# Patient Record
Sex: Male | Born: 1975 | Race: Black or African American | Hispanic: No | Smoking: Never smoker
Health system: Southern US, Community
[De-identification: ages and names within clinical notes are randomized; demographics above are authoritative.]

## PROBLEM LIST (undated history)

## (undated) DIAGNOSIS — I119 Hypertensive heart disease without heart failure: Secondary | ICD-10-CM

## (undated) DIAGNOSIS — G4730 Sleep apnea, unspecified: Secondary | ICD-10-CM

## (undated) DIAGNOSIS — I10 Essential (primary) hypertension: Secondary | ICD-10-CM

## (undated) DIAGNOSIS — J189 Pneumonia, unspecified organism: Secondary | ICD-10-CM

## (undated) DIAGNOSIS — I214 Non-ST elevation (NSTEMI) myocardial infarction: Secondary | ICD-10-CM

## (undated) HISTORY — DX: Non-ST elevation (NSTEMI) myocardial infarction: I21.4

## (undated) HISTORY — DX: Sleep apnea, unspecified: G47.30

## (undated) HISTORY — DX: Essential (primary) hypertension: I10

## (undated) HISTORY — DX: Hypertensive heart disease without heart failure: I11.9

## (undated) HISTORY — DX: Pneumonia, unspecified organism: J18.9

---

## 1998-08-30 ENCOUNTER — Emergency Department (HOSPITAL_COMMUNITY): Admission: EM | Admit: 1998-08-30 | Discharge: 1998-08-30 | Payer: Self-pay | Admitting: Emergency Medicine

## 1999-07-28 ENCOUNTER — Emergency Department (HOSPITAL_COMMUNITY): Admission: EM | Admit: 1999-07-28 | Discharge: 1999-07-28 | Payer: Self-pay | Admitting: Emergency Medicine

## 1999-07-28 ENCOUNTER — Encounter: Payer: Self-pay | Admitting: Emergency Medicine

## 2003-12-06 ENCOUNTER — Emergency Department (HOSPITAL_COMMUNITY): Admission: EM | Admit: 2003-12-06 | Discharge: 2003-12-06 | Payer: Self-pay | Admitting: Emergency Medicine

## 2009-01-02 ENCOUNTER — Ambulatory Visit: Payer: Self-pay | Admitting: Internal Medicine

## 2009-01-02 ENCOUNTER — Inpatient Hospital Stay (HOSPITAL_COMMUNITY): Admission: EM | Admit: 2009-01-02 | Discharge: 2009-01-10 | Payer: Self-pay | Admitting: Emergency Medicine

## 2009-01-10 ENCOUNTER — Encounter (INDEPENDENT_AMBULATORY_CARE_PROVIDER_SITE_OTHER): Payer: Self-pay | Admitting: Internal Medicine

## 2009-01-10 ENCOUNTER — Encounter: Payer: Self-pay | Admitting: Internal Medicine

## 2009-01-11 ENCOUNTER — Ambulatory Visit: Payer: Self-pay | Admitting: Internal Medicine

## 2009-01-11 ENCOUNTER — Telehealth: Payer: Self-pay | Admitting: *Deleted

## 2009-01-11 ENCOUNTER — Encounter: Payer: Self-pay | Admitting: Internal Medicine

## 2009-01-11 DIAGNOSIS — N189 Chronic kidney disease, unspecified: Secondary | ICD-10-CM | POA: Insufficient documentation

## 2009-01-11 DIAGNOSIS — I10 Essential (primary) hypertension: Secondary | ICD-10-CM

## 2009-01-13 ENCOUNTER — Encounter: Payer: Self-pay | Admitting: Internal Medicine

## 2009-01-13 ENCOUNTER — Ambulatory Visit: Payer: Self-pay | Admitting: *Deleted

## 2009-01-13 ENCOUNTER — Telehealth: Payer: Self-pay | Admitting: Internal Medicine

## 2009-01-13 LAB — CONVERTED CEMR LAB
CO2: 25 meq/L (ref 19–32)
GFR calc Af Amer: >60 mL/min (ref >60)
Glucose, Bld: 89 mg/dL (ref 70–99)
Potassium: 4.3 meq/L (ref 3.5–5.3)
Sodium: 138 meq/L (ref 135–145)

## 2009-01-18 ENCOUNTER — Ambulatory Visit: Payer: Self-pay | Admitting: Internal Medicine

## 2009-01-27 ENCOUNTER — Telehealth: Payer: Self-pay | Admitting: *Deleted

## 2009-02-01 ENCOUNTER — Ambulatory Visit: Payer: Self-pay | Admitting: Internal Medicine

## 2009-02-01 ENCOUNTER — Encounter: Payer: Self-pay | Admitting: Internal Medicine

## 2009-02-01 DIAGNOSIS — R5381 Other malaise: Secondary | ICD-10-CM

## 2009-02-01 DIAGNOSIS — R5383 Other fatigue: Secondary | ICD-10-CM

## 2009-02-02 LAB — CONVERTED CEMR LAB
BUN: 14 mg/dL (ref 6–23)
Chloride: 108 meq/L (ref 96–112)
GFR calc non Af Amer: 59 mL/min — ABNORMAL LOW (ref >60)
Glucose, Bld: 108 mg/dL — ABNORMAL HIGH (ref 70–99)
Potassium: 3.8 meq/L (ref 3.5–5.3)
Sodium: 142 meq/L (ref 135–145)

## 2009-05-09 ENCOUNTER — Telehealth: Payer: Self-pay | Admitting: Internal Medicine

## 2009-05-16 ENCOUNTER — Ambulatory Visit: Payer: Self-pay | Admitting: Internal Medicine

## 2009-08-09 ENCOUNTER — Telehealth: Payer: Self-pay | Admitting: Internal Medicine

## 2010-01-05 IMAGING — CR DG CHEST 2V
2 series · 2 of 2 positions shown · non-contrast
Comparison: None available.

CLINICAL DATA: Cough and fever.

CHEST - 2 VIEW

[w chest pa]
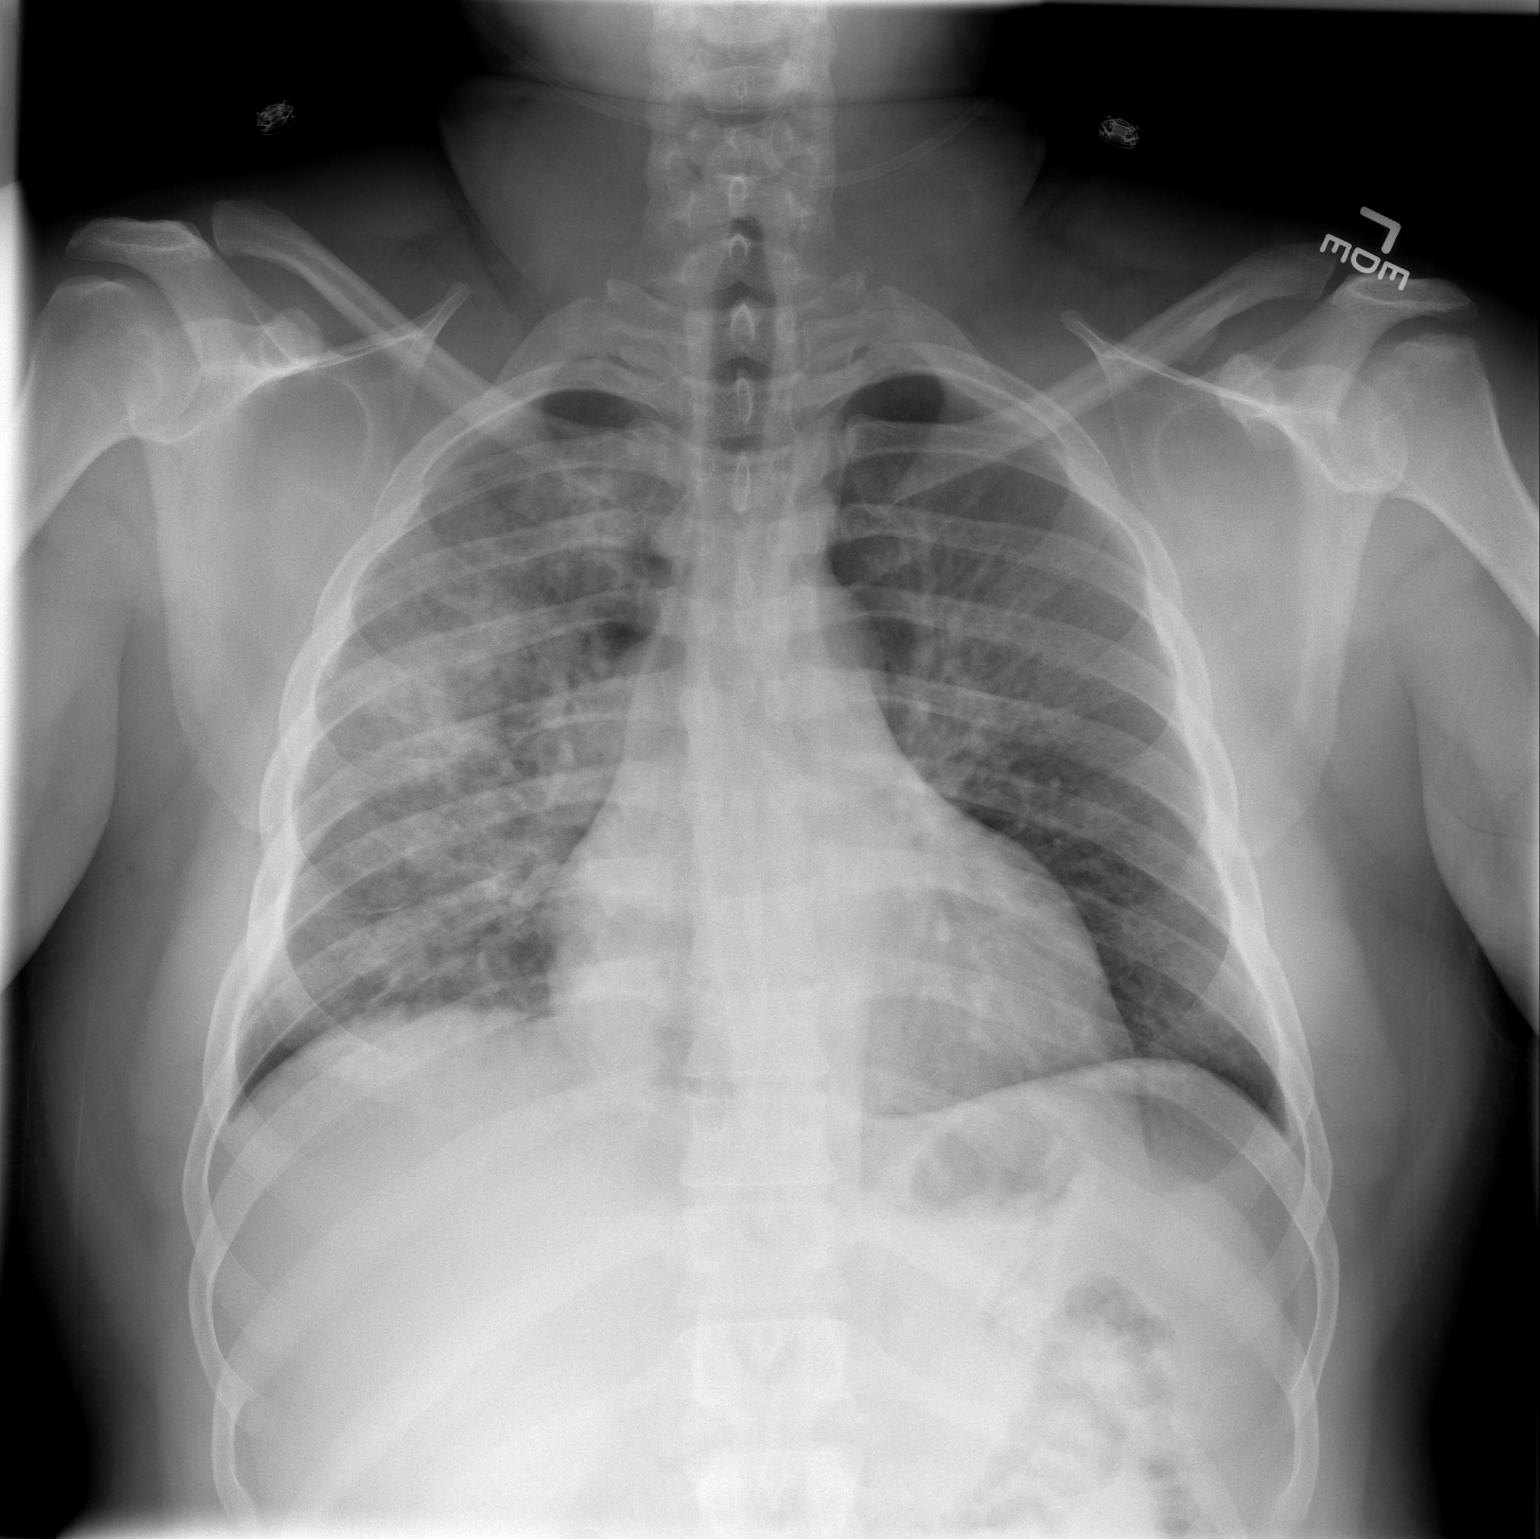

[w chest lat]
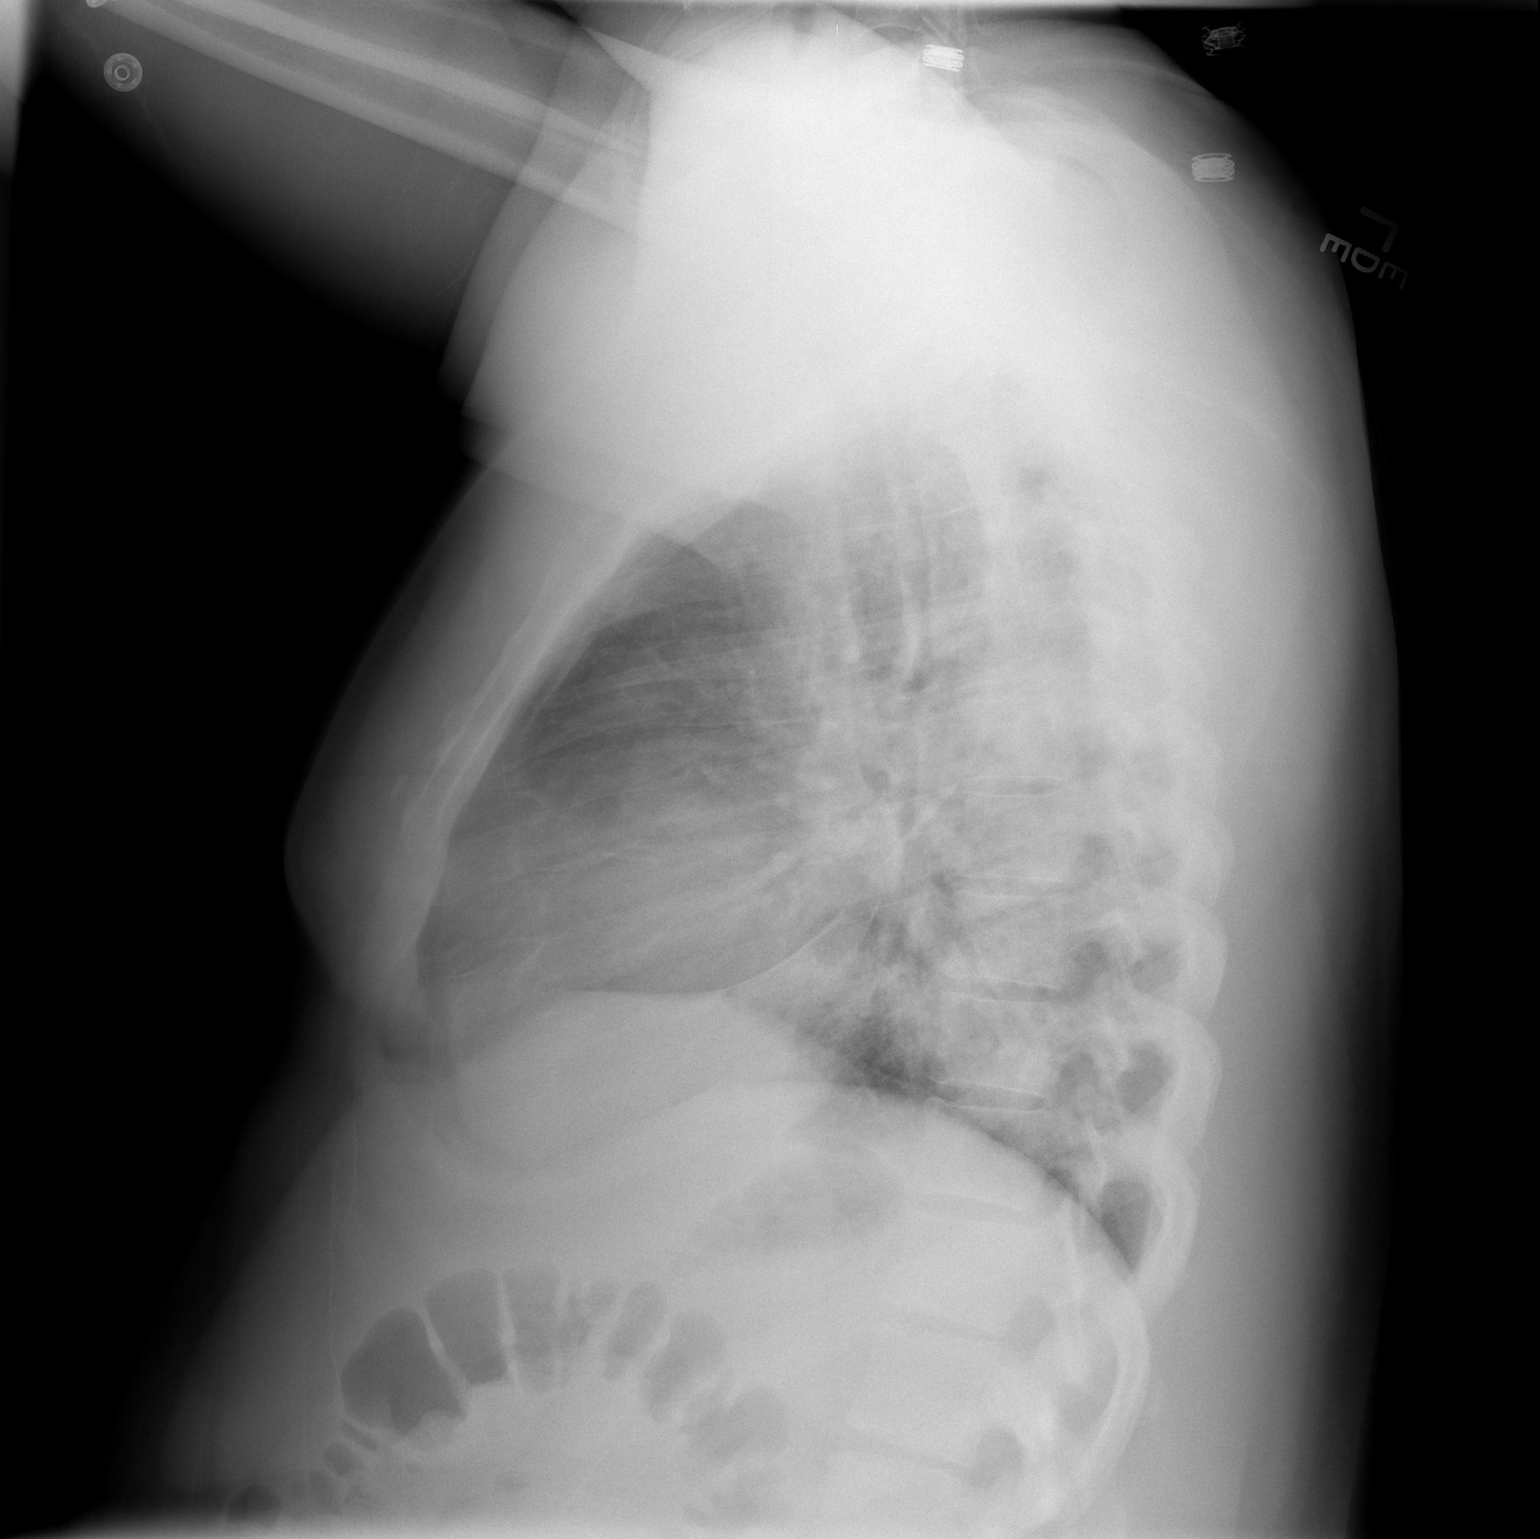

[2 of 2 positions shown; findings below may reference images not displayed]

FINDINGS: The patient has right much worse than left airspace
disease most confluent in the posterior right upper lobe and
superior segment of the right lower lobe compatible with pneumonia.
Heart size is normal.  Tiny right pleural effusion noted.
IMPRESSION: Right worse than left airspace disease is most compatible with
pneumonia.

## 2010-01-06 IMAGING — CR DG CHEST 1V PORT
1 series · 1 of 1 positions shown · non-contrast
Comparison: 01/02/2009.

CLINICAL DATA: Shortness of breath.  Respiratory distress.

PORTABLE CHEST - 1 VIEW

[view not recorded]
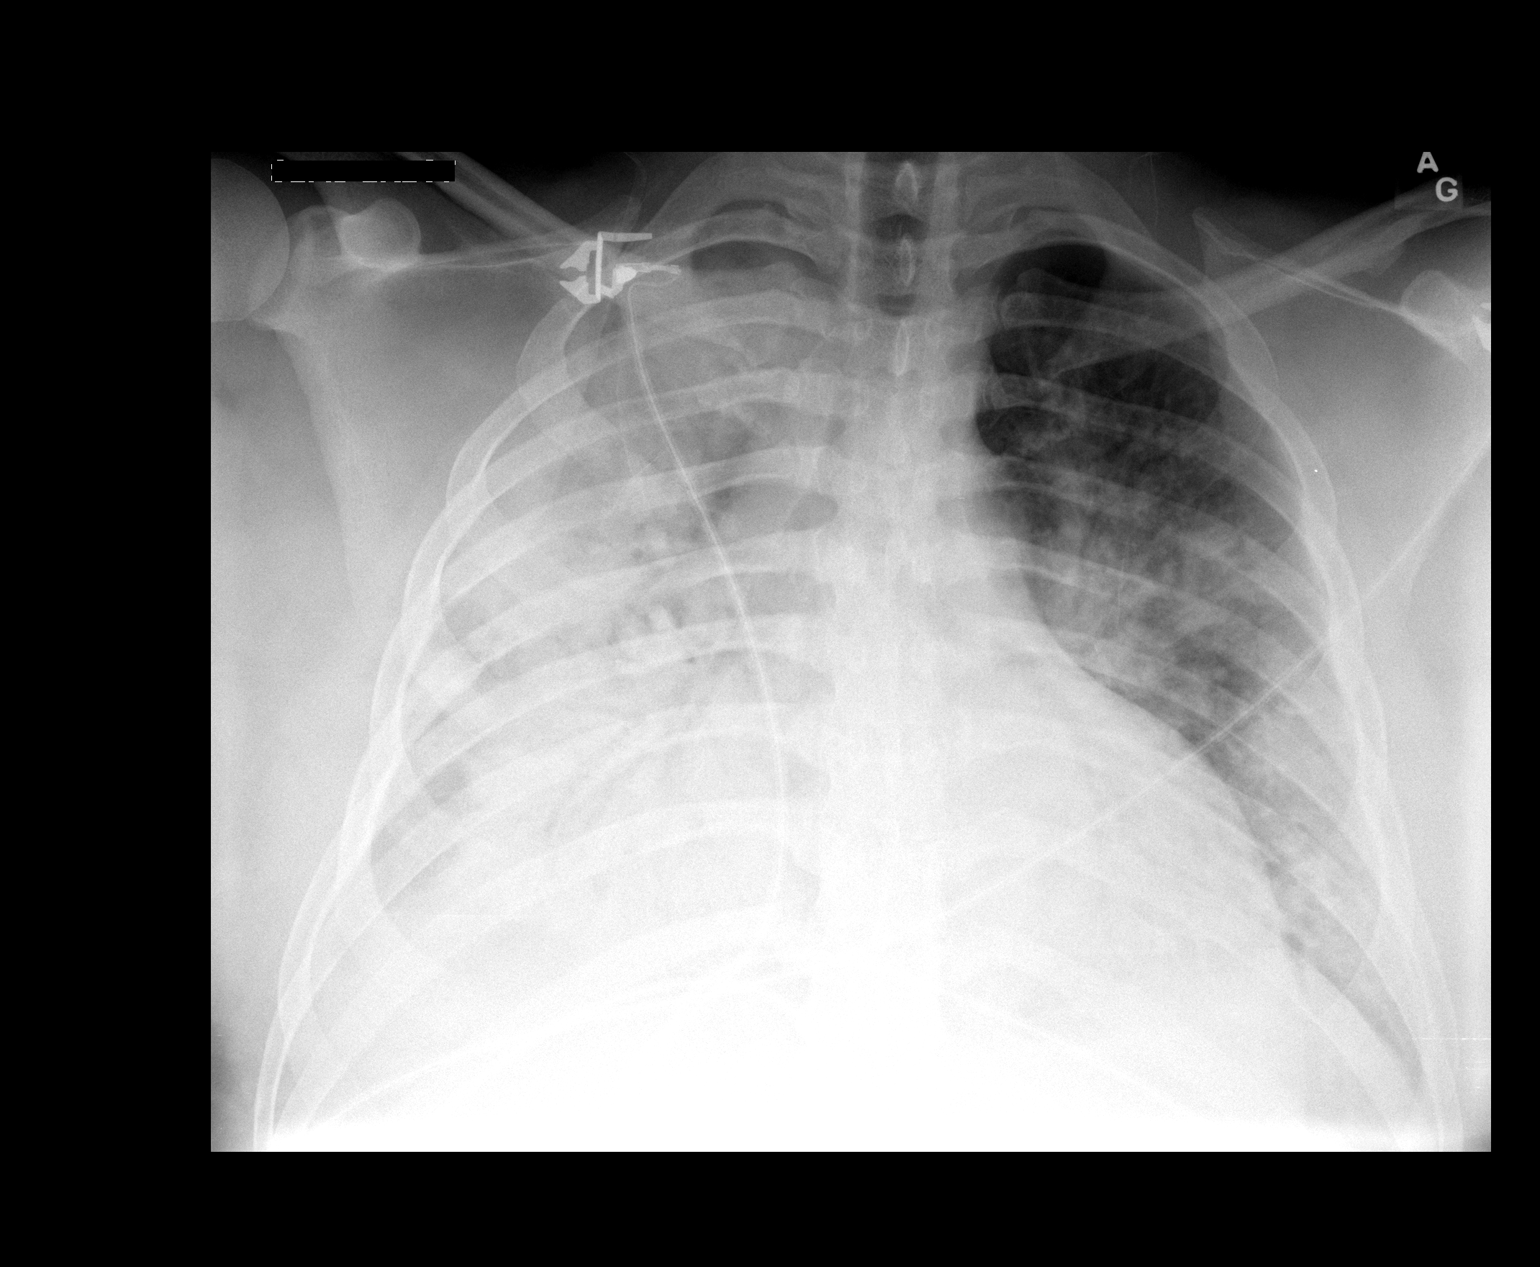

[1 of 1 positions shown; findings below may reference images not displayed]

FINDINGS: Interval significant increase in bilateral airspace
opacity, especially on the right.  There is obscuration of the
right heart border with no gross change in the size of the heart.
A probable small right pleural effusion is noted.  Unremarkable
bones.
IMPRESSION: 1.  Significant progression of bilateral pneumonia, especially on
the right.
2.  Probable small right pleural effusion.

## 2010-01-10 IMAGING — CR DG CHEST 1V PORT
1 series · 1 of 1 positions shown · non-contrast
Comparison: 01/06/2009

CLINICAL DATA: Pneumonia, extubation

PORTABLE CHEST - 1 VIEW

[AP]
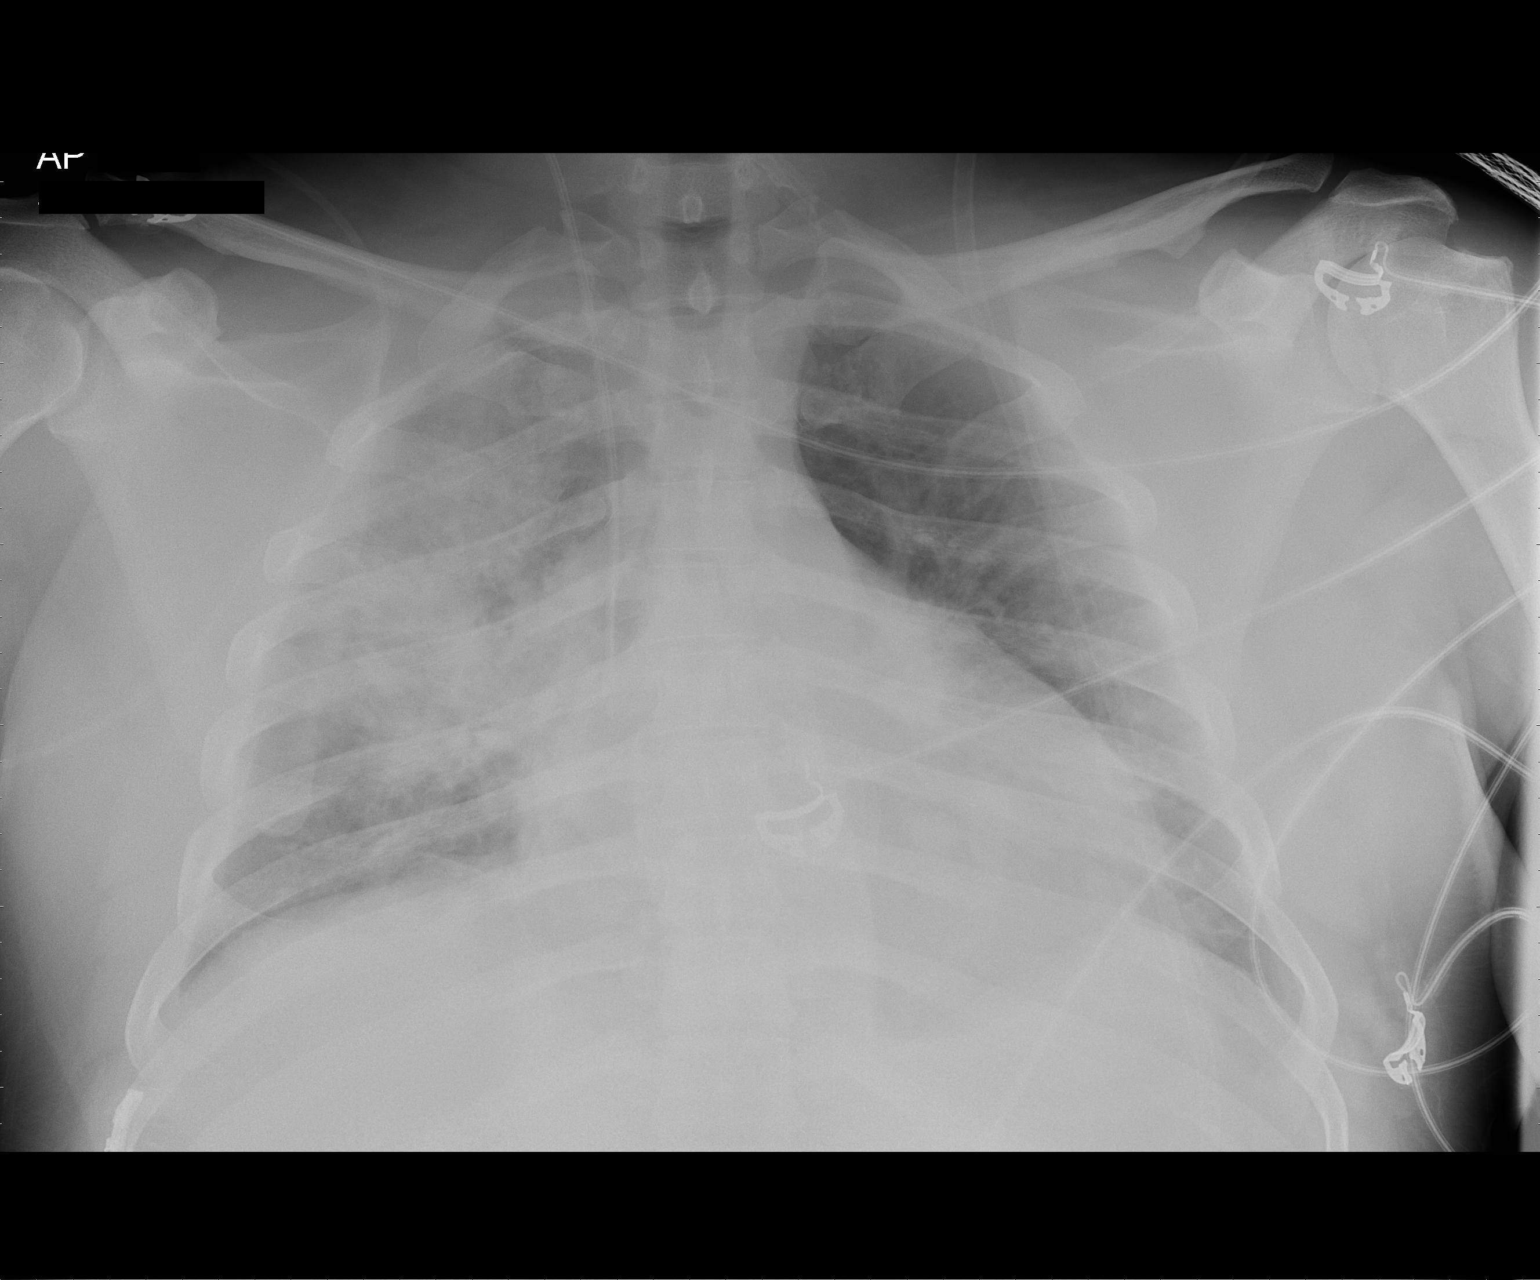

[1 of 1 positions shown; findings below may reference images not displayed]

FINDINGS: Interval removal of the endotracheal tube and NG tube.
Right IJ central line remains in the SVC.  Consolidative pneumonia
persist in the right lung similar to yesterday's exam.  Cardiac
silhouette appears enlarged.  Left lung aeration is stable.  No
large effusion or pneumothorax.  Midline trachea.
IMPRESSION: Persistent right lung consolidative pneumonia

## 2010-07-20 ENCOUNTER — Inpatient Hospital Stay (HOSPITAL_COMMUNITY)
Admission: EM | Admit: 2010-07-20 | Discharge: 2010-07-23 | Payer: Self-pay | Source: Home / Self Care | Admitting: Emergency Medicine

## 2010-07-20 ENCOUNTER — Ambulatory Visit: Payer: Self-pay | Admitting: Cardiology

## 2010-07-21 ENCOUNTER — Encounter: Payer: Self-pay | Admitting: Cardiology

## 2010-07-24 ENCOUNTER — Encounter: Payer: Self-pay | Admitting: Cardiology

## 2010-07-24 ENCOUNTER — Ambulatory Visit (HOSPITAL_COMMUNITY)
Admission: RE | Admit: 2010-07-24 | Discharge: 2010-07-24 | Payer: Self-pay | Source: Home / Self Care | Admitting: Cardiology

## 2010-07-24 ENCOUNTER — Ambulatory Visit: Payer: Self-pay

## 2010-07-25 ENCOUNTER — Telehealth (INDEPENDENT_AMBULATORY_CARE_PROVIDER_SITE_OTHER): Payer: Self-pay

## 2010-07-26 ENCOUNTER — Ambulatory Visit: Payer: Self-pay

## 2010-07-26 ENCOUNTER — Encounter: Payer: Self-pay | Admitting: Internal Medicine

## 2010-07-26 ENCOUNTER — Encounter: Payer: Self-pay | Admitting: Physician Assistant

## 2010-07-26 ENCOUNTER — Encounter (HOSPITAL_COMMUNITY)
Admission: RE | Admit: 2010-07-26 | Discharge: 2010-09-18 | Payer: Self-pay | Source: Home / Self Care | Attending: Cardiology | Admitting: Cardiology

## 2010-07-26 ENCOUNTER — Ambulatory Visit: Payer: Self-pay | Admitting: Internal Medicine

## 2010-07-26 DIAGNOSIS — R9430 Abnormal result of cardiovascular function study, unspecified: Secondary | ICD-10-CM | POA: Insufficient documentation

## 2010-07-26 DIAGNOSIS — I5032 Chronic diastolic (congestive) heart failure: Secondary | ICD-10-CM

## 2010-07-26 DIAGNOSIS — I214 Non-ST elevation (NSTEMI) myocardial infarction: Secondary | ICD-10-CM | POA: Insufficient documentation

## 2010-07-26 LAB — CONVERTED CEMR LAB
Basophils Relative: 0.8 % (ref 0.0–3.0)
CO2: 30 meq/L (ref 19–32)
Chloride: 103 meq/L (ref 96–112)
Creatinine, Ser: 1.3 mg/dL (ref 0.4–1.5)
Eosinophils Absolute: 0.1 10*3/uL (ref 0.0–0.7)
Eosinophils Relative: 1.6 % (ref 0.0–5.0)
HCT: 38.7 % — ABNORMAL LOW (ref 39.0–52.0)
Lymphs Abs: 2.3 10*3/uL (ref 0.7–4.0)
MCHC: 31.6 g/dL (ref 30.0–36.0)
MCV: 76 fL — ABNORMAL LOW (ref 78.0–100.0)
Monocytes Absolute: 0.8 10*3/uL (ref 0.1–1.0)
Neutro Abs: 4.5 10*3/uL (ref 1.4–7.7)
Neutrophils Relative %: 57.4 % (ref 43.0–77.0)
Potassium: 3.9 meq/L (ref 3.5–5.1)
RBC: 5.09 M/uL (ref 4.22–5.81)
WBC: 7.8 10*3/uL (ref 4.5–10.5)

## 2010-07-30 ENCOUNTER — Ambulatory Visit
Admission: RE | Admit: 2010-07-30 | Discharge: 2010-07-30 | Payer: Self-pay | Source: Home / Self Care | Attending: Cardiology | Admitting: Cardiology

## 2010-08-01 ENCOUNTER — Telehealth: Payer: Self-pay | Admitting: Cardiology

## 2010-08-02 ENCOUNTER — Telehealth (INDEPENDENT_AMBULATORY_CARE_PROVIDER_SITE_OTHER): Payer: Self-pay | Admitting: *Deleted

## 2010-08-03 ENCOUNTER — Ambulatory Visit: Payer: Self-pay | Admitting: Pulmonary Disease

## 2010-08-07 ENCOUNTER — Encounter: Payer: Self-pay | Admitting: Cardiology

## 2010-08-07 ENCOUNTER — Telehealth: Payer: Self-pay | Admitting: Cardiology

## 2010-08-29 ENCOUNTER — Encounter: Payer: Self-pay | Admitting: Cardiology

## 2010-08-30 ENCOUNTER — Ambulatory Visit: Admit: 2010-08-30 | Payer: Self-pay | Admitting: Cardiology

## 2010-09-06 ENCOUNTER — Telehealth: Payer: Self-pay | Admitting: Pulmonary Disease

## 2010-09-16 LAB — CONVERTED CEMR LAB
CO2: 27 meq/L (ref 19–32)
Chloride: 100 meq/L (ref 96–112)
GFR calc non Af Amer: 51 mL/min — ABNORMAL LOW (ref >60)
Sodium: 135 meq/L (ref 135–145)

## 2010-09-18 NOTE — Assessment & Plan Note (Signed)
Summary: Cardiology Nuclear Testing  Nuclear Med Background Indications for Stress Test: Evaluation for Ischemia, Post Hospital  Indications Comments: 07/23/10 Acute on chronic CHF,HTN urgency with increase troponin, presumed NSTEMI.  History: Abnormal EKG, Asthma, Myocardial Infarction  History Comments: 07/20/10 NSTEMI. 07/24/10 Echo: EF=60-65%, severe LVH. Hx. CHF.  Symptoms: DOE, Fatigue, Palpitations, SOB    Nuclear Pre-Procedure Cardiac Risk Factors: Family History - CAD, Hypertension Caffeine/Decaff Intake: None NPO After: 11:30 PM Lungs: clear IV 0.9% NS with Angio Cath: 22g     IV Site: L Antecubital IV Started by: Irean Hong, RN Chest Size (in) 46     Height (in): 67 Weight (lb): 228 BMI: 35.84 Tech Comments: Held bystolic today. This patient's EKG is very abnormal and now has inverted T's in all inferior leads. Having the pictures checked.  Nuclear Med Study 1 or 2 day study:  1 day     Stress Test Type:  Eugenie Birks Reading MD:  Arvilla Meres, MD     Referring MD:  Shela Commons.Hochrein Resting Radionuclide:  Technetium 38m Tetrofosmin     Resting Radionuclide Dose:  11 mCi  Stress Radionuclide:  Technetium 64m Tetrofosmin     Stress Radionuclide Dose:  33 mCi   Stress Protocol  Max Systolic BP: 145 mm Hg Lexiscan: 0.4 mg   Stress Test Technologist:  Milana Na, EMT-P     Nuclear Technologist:  Doyne Keel, CNMT  Rest Procedure  Myocardial perfusion imaging was performed at rest 45 minutes following the intravenous administration of Technetium 59m Tetrofosmin.  Stress Procedure  The patient received IV Lexiscan 0.4 mg over 15-seconds.  Technetium 8m Tetrofosmin injected at 30-seconds.  There were no significant changes with infusion.  Quantitative spect images were obtained after a 45 minute delay.  QPS Raw Data Images:  Normal; no motion artifact; LV is dilated Stress Images:  Decresed uptake in the anterior, lateral and base of the inferior wall.  Rest  Images:  Mild global decrease in uptake  Subtraction (SDS):  Reversible defects in the anterior, lateral and inferior walls.  Transient Ischemic Dilatation:  1.17  (Normal <1.22)  Lung/Heart Ratio:  .21  (Normal <0.45)  Quantitative Gated Spect Images QGS EDV:  186 ml QGS ESV:  109 ml QGS EF:  41 % QGS cine images:  Global hypokinesis  Findings Abormal nuclear study      Overall Impression  Exercise Capacity: Lexiscan with no exercise. ECG Impression: No significant ST segment change suggestive of ischemia. Overall Impression: Abnormal stress nuclear study. Overall Impression Comments: The LV is dilated with mild to moderate LVglobal hypokineis. Perfusion is globally dimished so interpretation is difficult. There appears to be reversible defects in the anterior, lateral and inferior walls with borderline transient ischemic dilation with stress suggestive of possible multi-vessel CAD.   Appended Document: Cardiology Nuclear Testing Patient is to be cathed on Monday.

## 2010-09-18 NOTE — Letter (Signed)
Summary: Cardiac Catheterization Instructions- [facility]  [facility], Main Office  [address]   [place], [place] [address]   Phone: [phone]  Fax: [phone]     07/26/2010 MRN: [id number]  [facility] [address] [place], [place]  [address]  Dear Mr. Eyster,   You are scheduled for a Cardiac Catheterization on 07/30/10 with Dr.Hochrein.  Please arrive to the 1st floor of the Heart and Vascular Center at [facility] at 12:30 PM on the day of your procedure. Please do not arrive before 6:30 a.m. Call the Heart and Vascular Center at [phone] if you are unable to make your appointmnet. The Code to get into the parking garage under the building is 0003. Take the elevators to the 1st floor. You must have someone to drive you home. Someone must be with you for the first 24 hours after you arrive home. Please wear clothes that are easy to get on and off and wear slip-on shoes. Do not eat or drink after midnight except water with your medications that morning. Bring all your medications and current insurance cards with you.  ___ DO NOT take these medications before your procedure: ________________________________________________________________  _X__ Make sure you take your aspirin.  __X_ You may take ALL of your medications with water that morning. ________________________________________________________________________________________________________________________________  ___ DO NOT take ANY medications before your procedure.  ___ Pre-med instructions:  ________________________________________________________________________________________________________________________________  The usual length of stay after your procedure is 2 to 3 hours. This can vary.  If you have any questions, please call the office at the number listed above.   Ollen Gross, RN, BSN

## 2010-09-18 NOTE — Progress Notes (Signed)
Summary: Nuc. Pre-Procedure  Phone Note Outgoing Call Call back at [facility] Phone [phone]   Call placed by: Irean Hong, RN,  July 25, 2010 2:22 PM Summary of Call: Left message with information on Myoview Information Sheet (see scanned document for details).      Nuclear Med Background Indications for Stress Test: Evaluation for Ischemia, Post Hospital  Indications Comments: 07/23/10 Acute on chronic CHF,HTN urgency with increase troponin, presumed NSTEMI.  History: Abnormal EKG, Asthma, Echo, Myocardial Infarction  History Comments: 07/20/10 NSTEMI. 07/24/10 Echo: EF=60-65%, severe LVH. Hx. CHF.  Symptoms: DOE, Fatigue, SOB    Nuclear Pre-Procedure Cardiac Risk Factors: Family History - CAD, Hypertension Height (in): 67

## 2010-09-18 NOTE — Assessment & Plan Note (Signed)
Summary: abnormal nuclear stress test/nm   Visit Type:  Initial Consult Primary Provider:  Clerance Lav MD  CC:  Add on Abnormal nuclear stress test.  History of Present Illness: Primary Cardiologist:  Dr. Rollene Rotunda  Tyler Collier is a 35 yo male with HTN who presented to [facility] on 12/2 with abrupt onset of dsypnea.  He had a very high BP and an elevated Troponin.  It was felt that he developed a/c diastolic CHF in the setting of HTN urgency resulting in a Type 2 NSTEMI.  His meds were adjusted for appropriate BP control.  He was discharged and had an echo on 12/6 that demonstrated severe LVH, EF 60-65%; Grade 2 diastolic dysfunction and mild LAE.  He underwent outpatient Lexiscan myoview today that is markedly abnormal.  The official reading is pending but he appears to have ischemia in the inferior, anterior, lateral and apical walls.  His calculated EF is 41% and his T.I.D. ratio is 1.17.  He is added on to my schedule for further evaluation.  He denies any chest pain.  He has occ. dyspnea.  He denies any change in his symptoms.  He notes NYHA class 2 symptoms.  He denies orthopnea, PND or edema.  No syncope.  He is a somewhat anxious about his condition.    Current Medications (verified): 1)  Ventolin Hfa 108 (90 Base) Mcg/act Aers (Albuterol Sulfate) .... Take Two Puffs Every Four Hours As Needed For Shortness of Breath 2)  Norvasc 10 Mg Tabs (Amlodipine Besylate) .... Take 1 Tablet By Mouth Once A Day 3)  Bystolic 20 Mg Tabs (Nebivolol Hcl) .... Take 1 Tablet By Mouth Once A Day 4)  Hydrochlorothiazide 25 Mg Tabs (Hydrochlorothiazide) .... Take One Tablet By Mouth Daily. 5)  Potassium Chloride Cr 10 Meq Cr-Tabs (Potassium Chloride) .... Take 1 Tablet By Mouth Once A Day  Allergies: 1)  ! Lisinopril  Past History:  Past Medical History: Pneumonia- multilobar that required intubation s/p Type II NSTEMI 07/2010 in setting of a/c diastolic CHF and HTN  urgency Hypertension Chronic Diastolic CHF Echo 07/2010:  severe LVH; EF 60-65%; Gr. 2 Diast. Dysfxn; mild LAE Probable sleep apnea . . . OP sleep test to be arranged 12.2011 Asthma  Family History: Reviewed history from 01/18/2009 and no changes required. Mother - HTN Brother-twin no medical problems Dad - MI in 37s; died with CVA  Social History: Reviewed history from 01/18/2009 and no changes required. Married Has a son and ex-wife who live in [place]. 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  Does not smoke or drink alcohol.   Review of Systems       As per  the HPI.  All other systems reviewed and negative.   Vital Signs:  Patient profile:   35 year old male Height:      67 inches Weight:      228 pounds BMI:     35.84 Pulse rate:   55 / minute Pulse rhythm:   regular Resp:     18 per minute BP sitting:   147 / 95  (right arm) Cuff size:   large  Vitals Entered By: Vikki Ports (July 26, 2010 12:59 PM)  Physical Exam  General:  Well nourished, well developed in no acute distress HEENT: Normal Neck: No JVD Cardiac:  Normal S1, S2; RRR; no murmur Lungs:  Clear to auscultation bilaterally, no wheezing, rhonchi or rales Abd: Soft, nontender, no hepatomegaly, no bruits Ext: No edema Vascular: No  carotid  bruits; Femoral arteries 2+ bilaterally without bruits Skin: Warm and dry MSK:  No deformities Lymph: No adenopathy Endocrine:  No thyromegaly Neuro: CNs 2-12 intact; nonfocal    Impression & Recommendations:  Problem # 1:  NONSPECIFIC ABNORMAL UNSPEC CV FUNCTION STUDY (ICD-794.30) The patient requires cardiac catheterization.  Risks and benefits have been discussed with the patient and available family members.  Risks include but are not limited to death, heart attack, stroke, bleeding, infection or allergic reaction from the dye.  The patient is willing to accept these risks and proceed with catheterization.  He should continue ASA and will be given a Rx  for NTG.  Problem # 2:  ACUT MI SUBENDOCARDIAL INFARCT EPIS CARE UNS (ICD-410.70)  Type 2 NSTEMI in the setting of HTN urgency and a/c diastolic CHF. Continue ASA.  Orders: TLB-BMP (Basic Metabolic Panel-BMET) (80048-METABOL) TLB-CBC Platelet - w/Differential (85025-CBCD) TLB-PT (Protime) (85610-PTP) TLB-PTT (85730-PTTL)  Problem # 3:  CHRONIC DIASTOLIC HEART FAILURE (ICD-428.32)  Volume is stable. No change in current therapy.  Orders: TLB-BMP (Basic Metabolic Panel-BMET) (80048-METABOL) TLB-CBC Platelet - w/Differential (85025-CBCD) TLB-PT (Protime) (85610-PTP) TLB-PTT (85730-PTTL)  Problem # 4:  ESSENTIAL HYPERTENSION, BENIGN (ICD-401.1)  BP is elevated, but he just took his meds. No change in therapy. He had a cough with ACE inhib's.  Could use an ARB if his BP requires further treatment.  Patient Instructions: 1)  Your physician has requested that you have a cardiac catheterization.  Cardiac catheterization is used to diagnose and/or treat various heart conditions. Doctors may recommend this procedure for a number of different reasons. The most common reason is to evaluate chest pain. Chest pain can be a symptom of coronary artery disease (CAD), and cardiac catheterization can show whether plaque is narrowing or blocking your heart's arteries. This procedure is also used to evaluate the valves, as well as measure the blood flow and oxygen levels in different parts of your heart.  For further information please visit [email or web address]  Please follow instruction sheet, as given. 2)  Your physician recommends that you return for lab work in: Today BMET, CBC, PT,PTT. 3)  Your physician has recommended you make the following change in your medication: Nitroglycerin SL, take one tablet onder the tounge on the onset of chest pain. You may repeat dose  every 5 minutes, for a total of 3 doses. Prescriptions: NITROSTAT 0.4 MG SUBL (NITROGLYCERIN) 1 tablet under tongue at onset of  chest pain; you may repeat every 5 minutes for up to 3 doses.  #25 x 3   Entered by:   Ollen Gross, RN, BSN   Authorized by:   Tereso Newcomer PA-C   Signed by:   Ollen Gross, RN, BSN on 07/26/2010   Method used:   Electronically to        [facility] [phone]* (retail)       [address]       [place], [place]  [address]       Ph: [phone]       Fax: [phone]   RxID:   [id number]  I have personally reviewed the prescriptions today for accuracy. Tereso Newcomer PA-C  July 26, 2010 2:28 PM

## 2010-09-18 NOTE — Miscellaneous (Signed)
Summary: Appointment Canceled  Appointment status changed to canceled by [employer] on 07/23/2010 11:35 AM.  Cancellation Comments --------------------- LR3.EPH. ZO:109. DX: CHESTPAIN.DR.HOCHREIN. PT HAS ATENA.G D  Appointment Information ----------------------- Appt Type:  CARDIOLOGY NUCLEAR TESTING      Date:  Thursday, July 26, 2010      Time:  1:00 PM for 15 min   Urgency:  Routine   Made By:  Hoy Finlay Scheduler  To Visit:  [id number]    Reason:  LR3.EPH. UE:454. DX: CHESTPAIN.DR.HOCHREIN. PT HAS ATENA.G D  Appt Comments ------------- -- 07/23/10 11:35: ([facility]) CANCELED -- LR3.EPH. UJ:811. DX: CHESTPAIN.DR.HOCHREIN. PT HAS ATENA.G D -- 07/23/10 11:34: ([facility]) BOOKED -- Routine CARDIOLOGY NUCLEAR TESTING at 07/26/2010 1:00 PM for 15 min LR3.EPH. BJ:478. DX: CHESTPAIN.DR.HOCHREIN. PT HAS

## 2010-09-20 NOTE — Progress Notes (Signed)
  Received Attending Physicians statement form [employer] sent to [employer] along with [employer] packet for completion. Cala Bradford Mesiemore  August 02, 2010 1:36 PM     Appended Document:  Called [facility] for him to Pick up [employer] forms that are completed they were  faxed to [facility] along with Discharge Summary 07/20/10-07/23/10,Cath from 07/30/10,and Med List.Faxed to [phone].

## 2010-09-20 NOTE — Letter (Signed)
Summary: Return To Work  [facility], Main Office  [address]   [place], [place] [address]   Phone: [phone]  Fax: [phone]        08/07/2010    TO: Leodis Sias IT MAY CONCERN     RE: Tyler Collier [address] [name] [address]   The above named individual is under my medical care and may return to work on:  August 03, 2010  If you have any further questions or need additional information, please call.       Sincerely,      Charolotte Capuchin, RN for Dr. Rollene Rotunda

## 2010-09-20 NOTE — Progress Notes (Signed)
Summary: work note  left message to c/b  Phone Note Call from Patient Call back at [employer] [phone]   Caller: Patient Reason for Call: Talk to Nurse Summary of Call: pt would to know if he can have a note to return to work on friday.  Initial call taken by: Roe Coombs,  August 01, 2010 1:42 PM  Follow-up for Phone Call        Spoke with pt. He is not having problems after cath. Would  like to return to work this Friday. Job does not entail heavy lifting. He does not need note at this time but is asking about short term disablilty paperwork that was faxed to our office yesterday.  I told pt we would need for him to sign release before we could send records to insurance. He will come to office today to sign release.   Follow-up by: Dossie Arbour, RN, BSN,  August 01, 2010 2:53 PM

## 2010-09-20 NOTE — Progress Notes (Signed)
Summary: WORK NOTE  Phone Note Call from Patient Call back at [facility] Phone [phone]   Caller: Patient Reason for Call: Talk to Nurse Summary of Call: PT NEED A NOTE WORK STATING HE WAS ABLE TO GO BACK TO  WORK  ON 08/03/2010. PT NEEDS IT TO BE FAX TO #[phone] ATTN TO JUANITA WILSON. Initial call taken by: Roe Coombs,  August 07, 2010 4:44 PM  Follow-up for Phone Call        done and faxed Follow-up by: Charolotte Capuchin, RN,  August 07, 2010 5:08 PM

## 2010-09-20 NOTE — Miscellaneous (Signed)
  Clinical Lists Changes  Observations: Added new observation of CARDCATHFIND: RESULTS:  Hemodynamics:  LV 128/22, AO 125/103. Coronaries:  Left main was normal.  LAD was large, wrapping the apex, and was normal.  There were small diagonals which were normal.  The circumflex was a very large dominant vessel.  The AV groove was normal. Posterolateral was very large and normal.  The PDA was moderate sized and normal.  The right coronary artery is small and normal. Left ventriculogram:  Left ventriculogram was obtained in the RAO projection.  The EF 65% with normal wall motion.   CONCLUSION:  Normal coronary arteries. Normal left ventricular function.   PLAN:  The patient will have aggressive medical management.  I will consider MRI to further evaluate the markedly abnormal perfusion imaging.     (07/31/2010 10:38) Added new observation of NUCLEAR NOS: Exercise Capacity: Lexiscan with no exercise. ECG Impression: No significant ST segment change suggestive of ischemia. Overall Impression: Abnormal stress nuclear study. Overall Impression Comments: The LV is dilated with mild to moderate LVglobal hypokineis. Perfusion is globally dimished so interpretation is difficult. There appears to be reversible defects in the anterior, lateral and inferior walls with borderline transient ischemic dilation with stress suggestive of possible multi-vessel CAD.  (07/26/2010 10:39)      Cardiac Cath  Procedure date:  07/31/2010  Findings:      RESULTS:  Hemodynamics:  LV 128/22, AO 125/103. Coronaries:  Left main was normal.  LAD was large, wrapping the apex, and was normal.  There were small diagonals which were normal.  The circumflex was a very large dominant vessel.  The AV groove was normal. Posterolateral was very large and normal.  The PDA was moderate sized and normal.  The right coronary artery is small and normal. Left ventriculogram:  Left ventriculogram was obtained in the  RAO projection.  The EF 65% with normal wall motion.   CONCLUSION:  Normal coronary arteries. Normal left ventricular function.   PLAN:  The patient will have aggressive medical management.  I will consider MRI to further evaluate the markedly abnormal perfusion imaging.      Nuclear Study  Procedure date:  07/26/2010  Findings:      Exercise Capacity: Lexiscan with no exercise. ECG Impression: No significant ST segment change suggestive of ischemia. Overall Impression: Abnormal stress nuclear study. Overall Impression Comments: The LV is dilated with mild to moderate LVglobal hypokineis. Perfusion is globally dimished so interpretation is difficult. There appears to be reversible defects in the anterior, lateral and inferior walls with borderline transient ischemic dilation with stress suggestive of possible multi-vessel CAD.

## 2010-09-20 NOTE — Progress Notes (Signed)
Summary: nos appt  Phone Note Call from Patient   Caller: [email or web address]  Call For: Tyler Collier Summary of Call: LMTCB x2 to rsc nos from 1/18. Initial call taken by: Darletta Moll,  September 06, 2010 3:14 PM

## 2010-09-20 NOTE — Progress Notes (Signed)
Summary: Calling about disability fpr the pt  Phone Note From Other Clinic   Caller: [employer]/ Jearld Fenton [phone] Summary of Call: Questions about the pt disability for being out work 07-26-10- 12-15/11 return to work 08/03/10 Initial call taken by: Judie Grieve,  August 07, 2010 2:21 PM  Follow-up for Phone Call        Spoke with Jearld Fenton, aware Dr Antoine Poche does support the time pt was out of work Follow-up by: Charolotte Capuchin, RN,  August 07, 2010 2:39 PM

## 2010-10-30 LAB — CK TOTAL AND CKMB (NOT AT ARMC)
CK, MB: 9.1 ng/mL (ref 0.3–4.0)
Relative Index: 1.8 (ref 0.0–2.5)

## 2010-10-30 LAB — DIFFERENTIAL
Basophils Absolute: 0.1 10*3/uL (ref 0.0–0.1)
Basophils Relative: 1 % (ref 0–1)
Eosinophils Relative: 2 % (ref 0–5)
Monocytes Absolute: 0.8 10*3/uL (ref 0.1–1.0)
Monocytes Relative: 7 % (ref 3–12)
Neutro Abs: 5.6 10*3/uL (ref 1.7–7.7)

## 2010-10-30 LAB — BASIC METABOLIC PANEL
BUN: 12 mg/dL (ref 6–23)
CO2: 26 mEq/L (ref 19–32)
CO2: 27 mEq/L (ref 19–32)
Calcium: 8.8 mg/dL (ref 8.4–10.5)
Chloride: 106 mEq/L (ref 96–112)
Creatinine, Ser: 1.32 mg/dL (ref 0.4–1.5)
GFR calc Af Amer: >60 mL/min (ref >60)
GFR calc non Af Amer: >60 mL/min (ref >60)
GFR calc non Af Amer: >60 mL/min (ref >60)
Glucose, Bld: 102 mg/dL — ABNORMAL HIGH (ref 70–99)
Glucose, Bld: 98 mg/dL (ref 70–99)
Potassium: 3.4 mEq/L — ABNORMAL LOW (ref 3.5–5.1)
Sodium: 140 mEq/L (ref 135–145)

## 2010-10-30 LAB — CBC
HCT: 39.7 % (ref 39.0–52.0)
Hemoglobin: 12.1 g/dL — ABNORMAL LOW (ref 13.0–17.0)
Hemoglobin: 12.1 g/dL — ABNORMAL LOW (ref 13.0–17.0)
MCH: 23.1 pg — ABNORMAL LOW (ref 26.0–34.0)
MCHC: 30.5 g/dL (ref 30.0–36.0)
MCHC: 30.6 g/dL (ref 30.0–36.0)
MCV: 75.6 fL — ABNORMAL LOW (ref 78.0–100.0)
MCV: 75.8 fL — ABNORMAL LOW (ref 78.0–100.0)
RBC: 5.24 MIL/uL (ref 4.22–5.81)
RDW: 14.7 % (ref 11.5–15.5)

## 2010-10-30 LAB — POCT CARDIAC MARKERS
CKMB, poc: 6.9 ng/mL (ref 1.0–8.0)
Troponin i, poc: 0.23 ng/mL — ABNORMAL HIGH (ref 0.00–0.09)

## 2010-10-30 LAB — TROPONIN I: Troponin I: 0.5 ng/mL (ref 0.00–0.06)

## 2010-10-30 LAB — CARDIAC PANEL(CRET KIN+CKTOT+MB+TROPI): Relative Index: 1.4 (ref 0.0–2.5)

## 2010-11-27 LAB — PROTIME-INR: Prothrombin Time: 16.4 seconds — ABNORMAL HIGH (ref 11.6–15.2)

## 2010-11-27 LAB — COMPREHENSIVE METABOLIC PANEL
ALT: 19 U/L (ref 0–53)
ALT: 28 U/L (ref 0–53)
ALT: 46 U/L (ref 0–53)
AST: 24 U/L (ref 0–37)
Albumin: 2.2 g/dL — ABNORMAL LOW (ref 3.5–5.2)
Alkaline Phosphatase: 50 U/L (ref 39–117)
Alkaline Phosphatase: 51 U/L (ref 39–117)
Alkaline Phosphatase: 62 U/L (ref 39–117)
BUN: 19 mg/dL (ref 6–23)
CO2: 24 mEq/L (ref 19–32)
CO2: 27 mEq/L (ref 19–32)
Calcium: 7.5 mg/dL — ABNORMAL LOW (ref 8.4–10.5)
Calcium: 8.5 mg/dL (ref 8.4–10.5)
Chloride: 109 mEq/L (ref 96–112)
Chloride: 109 mEq/L (ref 96–112)
GFR calc Af Amer: >60 mL/min (ref >60)
GFR calc non Af Amer: 37 mL/min — ABNORMAL LOW (ref >60)
GFR calc non Af Amer: >60 mL/min (ref >60)
Glucose, Bld: 134 mg/dL — ABNORMAL HIGH (ref 70–99)
Glucose, Bld: 85 mg/dL (ref 70–99)
Potassium: 3.8 mEq/L (ref 3.5–5.1)
Potassium: 4 mEq/L (ref 3.5–5.1)
Potassium: 4.2 mEq/L (ref 3.5–5.1)
Sodium: 141 mEq/L (ref 135–145)
Sodium: 142 mEq/L (ref 135–145)
Sodium: 142 mEq/L (ref 135–145)
Total Bilirubin: 0.9 mg/dL (ref 0.3–1.2)
Total Bilirubin: 1.1 mg/dL (ref 0.3–1.2)
Total Protein: 5.4 g/dL — ABNORMAL LOW (ref 6.0–8.3)

## 2010-11-27 LAB — POCT I-STAT 3, ART BLOOD GAS (G3+)
Acid-base deficit: 1 mmol/L (ref 0.0–2.0)
Bicarbonate: 23.7 mEq/L (ref 20.0–24.0)
Bicarbonate: 24.5 mEq/L — ABNORMAL HIGH (ref 20.0–24.0)
Bicarbonate: 24.5 mEq/L — ABNORMAL HIGH (ref 20.0–24.0)
Bicarbonate: 25.2 mEq/L — ABNORMAL HIGH (ref 20.0–24.0)
Bicarbonate: 25.2 mEq/L — ABNORMAL HIGH (ref 20.0–24.0)
O2 Saturation: 100 %
O2 Saturation: 92 %
O2 Saturation: 99 %
Patient temperature: 100.3
Patient temperature: 100.8
Patient temperature: 101.6
Patient temperature: 101.6
TCO2: 25 mmol/L (ref 0–100)
TCO2: 26 mmol/L (ref 0–100)
TCO2: 26 mmol/L (ref 0–100)
TCO2: 27 mmol/L (ref 0–100)
pCO2 arterial: 35 mmHg (ref 35.0–45.0)
pCO2 arterial: 35.1 mmHg (ref 35.0–45.0)
pCO2 arterial: 36.7 mmHg (ref 35.0–45.0)
pCO2 arterial: 42.2 mmHg (ref 35.0–45.0)
pCO2 arterial: 43.6 mmHg (ref 35.0–45.0)
pCO2 arterial: 45.1 mmHg — ABNORMAL HIGH (ref 35.0–45.0)
pH, Arterial: 7.362 (ref 7.350–7.450)
pH, Arterial: 7.405 (ref 7.350–7.450)
pH, Arterial: 7.417 (ref 7.350–7.450)
pH, Arterial: 7.418 (ref 7.350–7.450)
pH, Arterial: 7.439 (ref 7.350–7.450)
pH, Arterial: 7.467 — ABNORMAL HIGH (ref 7.350–7.450)
pO2, Arterial: 123 mmHg — ABNORMAL HIGH (ref 80.0–100.0)
pO2, Arterial: 320 mmHg — ABNORMAL HIGH (ref 80.0–100.0)

## 2010-11-27 LAB — GLUCOSE, CAPILLARY
Glucose-Capillary: 100 mg/dL — ABNORMAL HIGH (ref 70–99)
Glucose-Capillary: 103 mg/dL — ABNORMAL HIGH (ref 70–99)
Glucose-Capillary: 112 mg/dL — ABNORMAL HIGH (ref 70–99)
Glucose-Capillary: 128 mg/dL — ABNORMAL HIGH (ref 70–99)
Glucose-Capillary: 132 mg/dL — ABNORMAL HIGH (ref 70–99)
Glucose-Capillary: 95 mg/dL (ref 70–99)
Glucose-Capillary: 97 mg/dL (ref 70–99)
Glucose-Capillary: 98 mg/dL (ref 70–99)

## 2010-11-27 LAB — BASIC METABOLIC PANEL
BUN: 10 mg/dL (ref 6–23)
BUN: 21 mg/dL (ref 6–23)
CO2: 24 mEq/L (ref 19–32)
CO2: 27 mEq/L (ref 19–32)
CO2: 27 mEq/L (ref 19–32)
CO2: 28 mEq/L (ref 19–32)
CO2: 28 mEq/L (ref 19–32)
Calcium: 7.6 mg/dL — ABNORMAL LOW (ref 8.4–10.5)
Calcium: 8 mg/dL — ABNORMAL LOW (ref 8.4–10.5)
Calcium: 8.1 mg/dL — ABNORMAL LOW (ref 8.4–10.5)
Calcium: 8.2 mg/dL — ABNORMAL LOW (ref 8.4–10.5)
Calcium: 8.9 mg/dL (ref 8.4–10.5)
Chloride: 103 mEq/L (ref 96–112)
Chloride: 110 mEq/L (ref 96–112)
Creatinine, Ser: 1.02 mg/dL (ref 0.4–1.5)
Creatinine, Ser: 1.22 mg/dL (ref 0.4–1.5)
Creatinine, Ser: 1.45 mg/dL (ref 0.4–1.5)
Creatinine, Ser: 1.76 mg/dL — ABNORMAL HIGH (ref 0.4–1.5)
GFR calc Af Amer: 53 mL/min — ABNORMAL LOW (ref >60)
GFR calc Af Amer: 54 mL/min — ABNORMAL LOW (ref >60)
GFR calc Af Amer: >60 mL/min (ref >60)
GFR calc Af Amer: >60 mL/min (ref >60)
GFR calc Af Amer: >60 mL/min (ref >60)
GFR calc non Af Amer: 44 mL/min — ABNORMAL LOW (ref >60)
GFR calc non Af Amer: 55 mL/min — ABNORMAL LOW (ref >60)
GFR calc non Af Amer: >60 mL/min (ref >60)
GFR calc non Af Amer: >60 mL/min (ref >60)
GFR calc non Af Amer: >60 mL/min (ref >60)
Glucose, Bld: 100 mg/dL — ABNORMAL HIGH (ref 70–99)
Glucose, Bld: 91 mg/dL (ref 70–99)
Glucose, Bld: 96 mg/dL (ref 70–99)
Potassium: 3.3 mEq/L — ABNORMAL LOW (ref 3.5–5.1)
Potassium: 3.6 mEq/L (ref 3.5–5.1)
Potassium: 3.6 mEq/L (ref 3.5–5.1)
Potassium: 3.9 mEq/L (ref 3.5–5.1)
Potassium: 3.9 mEq/L (ref 3.5–5.1)
Potassium: 4.1 mEq/L (ref 3.5–5.1)
Potassium: 4.1 mEq/L (ref 3.5–5.1)
Sodium: 139 mEq/L (ref 135–145)
Sodium: 139 mEq/L (ref 135–145)
Sodium: 140 mEq/L (ref 135–145)
Sodium: 142 mEq/L (ref 135–145)
Sodium: 142 mEq/L (ref 135–145)
Sodium: 143 mEq/L (ref 135–145)

## 2010-11-27 LAB — URINALYSIS, MICROSCOPIC ONLY
Bilirubin Urine: NEGATIVE
Ketones, ur: NEGATIVE mg/dL
Specific Gravity, Urine: 1.022 (ref 1.005–1.030)
Urobilinogen, UA: 0.2 mg/dL (ref 0.0–1.0)

## 2010-11-27 LAB — TYPE AND SCREEN
ABO/RH(D): O POS
Antibody Screen: NEGATIVE
Antibody Screen: NEGATIVE

## 2010-11-27 LAB — MAGNESIUM
Magnesium: 2.1 mg/dL (ref 1.5–2.5)
Magnesium: 2.1 mg/dL (ref 1.5–2.5)
Magnesium: 2.1 mg/dL (ref 1.5–2.5)
Magnesium: 2.5 mg/dL (ref 1.5–2.5)

## 2010-11-27 LAB — ABO/RH: ABO/RH(D): O POS

## 2010-11-27 LAB — CULTURE, BAL-QUANTITATIVE W GRAM STAIN

## 2010-11-27 LAB — URINE CULTURE: Culture: NO GROWTH

## 2010-11-27 LAB — CARDIAC PANEL(CRET KIN+CKTOT+MB+TROPI)
CK, MB: 1.5 ng/mL (ref 0.3–4.0)
CK, MB: 2.1 ng/mL (ref 0.3–4.0)
Relative Index: 0.8 (ref 0.0–2.5)
Troponin I: 0.14 ng/mL — ABNORMAL HIGH (ref 0.00–0.06)
Troponin I: 0.14 ng/mL — ABNORMAL HIGH (ref 0.00–0.06)

## 2010-11-27 LAB — CBC
HCT: 23.4 % — ABNORMAL LOW (ref 39.0–52.0)
HCT: 26.6 % — ABNORMAL LOW (ref 39.0–52.0)
HCT: 27.1 % — ABNORMAL LOW (ref 39.0–52.0)
HCT: 31 % — ABNORMAL LOW (ref 39.0–52.0)
HCT: 34.9 % — ABNORMAL LOW (ref 39.0–52.0)
Hemoglobin: 10 g/dL — ABNORMAL LOW (ref 13.0–17.0)
Hemoglobin: 10.5 g/dL — ABNORMAL LOW (ref 13.0–17.0)
Hemoglobin: 11 g/dL — ABNORMAL LOW (ref 13.0–17.0)
Hemoglobin: 11.2 g/dL — ABNORMAL LOW (ref 13.0–17.0)
Hemoglobin: 7.7 g/dL — CL (ref 13.0–17.0)
Hemoglobin: 8.4 g/dL — ABNORMAL LOW (ref 13.0–17.0)
Hemoglobin: 8.6 g/dL — ABNORMAL LOW (ref 13.0–17.0)
Hemoglobin: 8.8 g/dL — ABNORMAL LOW (ref 13.0–17.0)
MCHC: 31.8 g/dL (ref 30.0–36.0)
MCHC: 32 g/dL (ref 30.0–36.0)
MCHC: 32.2 g/dL (ref 30.0–36.0)
MCHC: 32.9 g/dL (ref 30.0–36.0)
MCV: 74.2 fL — ABNORMAL LOW (ref 78.0–100.0)
MCV: 74.3 fL — ABNORMAL LOW (ref 78.0–100.0)
MCV: 75 fL — ABNORMAL LOW (ref 78.0–100.0)
Platelets: 208 10*3/uL (ref 150–400)
Platelets: 238 10*3/uL (ref 150–400)
Platelets: 256 10*3/uL (ref 150–400)
RBC: 3.15 MIL/uL — ABNORMAL LOW (ref 4.22–5.81)
RBC: 3.41 MIL/uL — ABNORMAL LOW (ref 4.22–5.81)
RBC: 3.51 MIL/uL — ABNORMAL LOW (ref 4.22–5.81)
RBC: 3.62 MIL/uL — ABNORMAL LOW (ref 4.22–5.81)
RBC: 4.44 MIL/uL (ref 4.22–5.81)
RBC: 4.7 MIL/uL (ref 4.22–5.81)
RDW: 14.5 % (ref 11.5–15.5)
RDW: 14.6 % (ref 11.5–15.5)
RDW: 14.9 % (ref 11.5–15.5)
RDW: 15.2 % (ref 11.5–15.5)
WBC: 13.6 10*3/uL — ABNORMAL HIGH (ref 4.0–10.5)
WBC: 14.1 10*3/uL — ABNORMAL HIGH (ref 4.0–10.5)
WBC: 14.9 10*3/uL — ABNORMAL HIGH (ref 4.0–10.5)
WBC: 15.9 10*3/uL — ABNORMAL HIGH (ref 4.0–10.5)
WBC: 16.3 10*3/uL — ABNORMAL HIGH (ref 4.0–10.5)

## 2010-11-27 LAB — BLOOD GAS, ARTERIAL
Drawn by: 296031
TCO2: 26 mmol/L (ref 0–100)
pCO2 arterial: 44.6 mmHg (ref 35.0–45.0)
pH, Arterial: 7.361 (ref 7.350–7.450)

## 2010-11-27 LAB — C4 COMPLEMENT: Complement C4, Body Fluid: 35 mg/dL (ref 16–47)

## 2010-11-27 LAB — PHOSPHORUS: Phosphorus: 2.1 mg/dL — ABNORMAL LOW (ref 2.3–4.6)

## 2010-11-27 LAB — HEMOGLOBIN A1C
Hgb A1c MFr Bld: 5.1 % (ref 4.6–6.1)
Mean Plasma Glucose: 100 mg/dL

## 2010-11-27 LAB — RETICULOCYTES
RBC.: 5 MIL/uL (ref 4.22–5.81)
Retic Ct Pct: 1.1 % (ref 0.4–3.1)

## 2010-11-27 LAB — LIPID PANEL
LDL Cholesterol: 107 mg/dL — ABNORMAL HIGH (ref 0–99)
VLDL: 13 mg/dL (ref 0–40)

## 2010-11-27 LAB — CULTURE, BLOOD (ROUTINE X 2)
Culture: NO GROWTH
Culture: NO GROWTH
Culture: NO GROWTH

## 2010-11-27 LAB — DIFFERENTIAL
Basophils Absolute: 0.1 10*3/uL (ref 0.0–0.1)
Basophils Relative: 0 % (ref 0–1)
Eosinophils Absolute: 0.2 10*3/uL (ref 0.0–0.7)
Eosinophils Relative: 0 % (ref 0–5)
Lymphocytes Relative: 8 % — ABNORMAL LOW (ref 12–46)
Lymphs Abs: 1.2 10*3/uL (ref 0.7–4.0)
Monocytes Absolute: 0.7 10*3/uL (ref 0.1–1.0)
Monocytes Relative: 11 % (ref 3–12)
Monocytes Relative: 5 % (ref 3–12)
Neutro Abs: 10.4 10*3/uL — ABNORMAL HIGH (ref 1.7–7.7)
Neutrophils Relative %: 74 % (ref 43–77)

## 2010-11-27 LAB — BODY FLUID CELL COUNT WITH DIFFERENTIAL
Eos, Fluid: 0 %
Neutrophil Count, Fluid: 25 % (ref 0–25)

## 2010-11-27 LAB — URINALYSIS, ROUTINE W REFLEX MICROSCOPIC
Hgb urine dipstick: NEGATIVE
Ketones, ur: 15 mg/dL — AB
Protein, ur: >300 mg/dL — AB
Urobilinogen, UA: 1 mg/dL (ref 0.0–1.0)

## 2010-11-27 LAB — D-DIMER, QUANTITATIVE: D-Dimer, Quant: 0.29 ug/mL-FEU (ref 0.00–0.48)

## 2010-11-27 LAB — HEPATIC FUNCTION PANEL
ALT: 21 U/L (ref 0–53)
AST: 16 U/L (ref 0–37)
Albumin: 2.8 g/dL — ABNORMAL LOW (ref 3.5–5.2)
Total Protein: 5.9 g/dL — ABNORMAL LOW (ref 6.0–8.3)

## 2010-11-27 LAB — CARBOXYHEMOGLOBIN
Carboxyhemoglobin: 1.1 % (ref 0.5–1.5)
Carboxyhemoglobin: 1.2 % (ref 0.5–1.5)
Carboxyhemoglobin: 1.2 % (ref 0.5–1.5)
Methemoglobin: 0.5 % (ref 0.0–1.5)
Methemoglobin: 1 % (ref 0.0–1.5)
O2 Saturation: 76.6 %
O2 Saturation: 78.3 %
O2 Saturation: 80.3 %
Total hemoglobin: 10.8 g/dL — ABNORMAL LOW (ref 13.5–18.0)

## 2010-11-27 LAB — EXPECTORATED SPUTUM ASSESSMENT W GRAM STAIN, RFLX TO RESP C

## 2010-11-27 LAB — POCT I-STAT, CHEM 8
BUN: 15 mg/dL (ref 6–23)
Calcium, Ion: 1.17 mmol/L (ref 1.12–1.32)
Chloride: 106 mEq/L (ref 96–112)
Creatinine, Ser: 1.4 mg/dL (ref 0.4–1.5)

## 2010-11-27 LAB — LACTIC ACID, PLASMA: Lactic Acid, Venous: 0.9 mmol/L (ref 0.5–2.2)

## 2010-11-27 LAB — BRAIN NATRIURETIC PEPTIDE: Pro B Natriuretic peptide (BNP): 223 pg/mL — ABNORMAL HIGH (ref 0.0–100.0)

## 2010-11-27 LAB — STREP PNEUMONIAE URINARY ANTIGEN: Strep Pneumo Urinary Antigen: NEGATIVE

## 2010-11-27 LAB — IRON AND TIBC: UIBC: 220 ug/dL

## 2010-11-27 LAB — LEGIONELLA ANTIGEN, URINE

## 2010-11-27 LAB — APTT: aPTT: 37 seconds (ref 24–37)

## 2010-11-27 LAB — TRIGLYCERIDES: Triglycerides: 65 mg/dL (ref <150)

## 2010-12-02 ENCOUNTER — Encounter: Payer: Self-pay | Admitting: Internal Medicine

## 2011-01-01 NOTE — Consult Note (Signed)
NAMEJAIME, GRIZZELL           ACCOUNT NO.:  [id number]   MEDICAL RECORD NO.:  [id number]          PATIENT TYPE:  INP   LOCATION:  2102                         FACILITY:  [facility]   PHYSICIAN:  Valetta Fuller, M.D.  DATE OF BIRTH:  May 01, 1976   DATE OF CONSULTATION:  01/03/2009  DATE OF DISCHARGE:                                 CONSULTATION   REASON FOR CONSULTATION:  Gross hematuria with inability to irrigate  Foley.  Replace Foley catheter.   HISTORY OF PRESENT ILLNESS:  Mr. Polo is 35 years of age.  He is  critically ill and currently intubated and fully ventilated for  respiratory distress.  According to a family member, he has no urologic  history.  Initial Foley catheter was placed due to his critical ill  status.  Apparently, there was some catheter trauma and some question as  to whether the Foley was dislodged during a period of agitation prior to  intubation.  The nursing staff had attempted to irrigate his catheter at  one point and removed his Foley and replaced it.  They had difficulties  with the catheter and reinsertion and we were contacted by Critical Care  for our assessment.   Upon entering the room, the patient was sedated and fully ventilated.  He was unable to provide any additional history.  Clinically, the Foley  catheter was seen in a position that was not consistent with balloon  being within the bladder.  Large portion of the Foley catheter was  emanating from the penis suggesting malpositioning.  There was blood per  urethra.   PAST MEDICAL HISTORY:  Otherwise noncontributory.   MEDICATIONS:  In current situation reviewed and documented within the  medical record.   PROCEDURE:  The Foley catheter was removed.  This resulted in continued  evidence of bleeding per urethra consistent again with Foley trauma.  Lidocaine jelly was instilled per urethra.  The patient's penis had been  prepped and draped in the usual manner.  A 16-French coude  catheter was  placed into the bladder.  We obtained several hundred mLs of blood  tinged urine.  Bladder irrigation was then performed with 500 mL of  normal saline.  A few small stringy clots were obtained and the urine  was then light pink.   ASSESSMENT:  Malpositioned urethral catheter with urethral trauma, gross  hematuria, and bleeding per urethra.  Foley catheter is now confirmed to  be in good position.  Catheter needs to stay indwelling a minimum of 72  hours and certainly longer if clinically indicated.  The patient should  have urine culture performed prior to catheter removal with appropriate  antibiotics.  There do not appear to be any other needs for urologic  reassessment at this point.  We should be reconsulted if other problems  do develop.      Valetta Fuller, M.D.  Electronically Signed     DSG/MEDQ  D:  01/04/2009  T:  01/04/2009  Job:  [id number]

## 2011-01-01 NOTE — Discharge Summary (Signed)
NAMEABRHAM, MASLOWSKI           ACCOUNT NO.:  [id number]   MEDICAL RECORD NO.:  [id number]          PATIENT TYPE:  INP   LOCATION:  3022                         FACILITY:  [facility]   PHYSICIAN:  Alvester Morin, M.D.  DATE OF BIRTH:  November 01, 1975   DATE OF ADMISSION:  01/02/2009  DATE OF DISCHARGE:  01/10/2009                               DISCHARGE SUMMARY   DISCHARGE DIAGNOSES:  1. Community-acquired pneumonia, requiring intubation.  2. Respiratory failure secondary to pneumonia.  3. Acute renal failure secondary to acute tubular necrosis.  4. Transaminitis secondary to shocked liver.   OTHER DIAGNOSIS:  5.Hypertension.   DISCHARGE MEDICATIONS:  1. Metoprolol 50 mg 1 tablet 2 times a day.  2. Albuterol take 2 puffs every 4 hours as needed.  3. Omeprazole 20 mg once a day.   DISPOSITION AND FOLLOWUP:  Mr. Todisco will come to the Outpatient  Clinic tomorrow, Jan 11, 2009 for a BMET to look at his creatinine which  was elevated at discharge. He will see Dr. Sherryll Burger in the Outpatient Clinic  on  January 18, 2009.  During that appointment, we need to repeat liver  function test, reassess symptoms of pneumonia and reassess blood  pressure.  We need to monitor his renal function.   CONSULTATION:  Critical Care for ventilator management.   PROCEDURE PERFORMED:  None.   HISTORY OF PRESENT ILLNESS:  This is a 35 year old with no significant  past medical history except for mild hypertension who presents  complaining of upper respiratory symptoms for the last 14 days.  He  relates cough and chest congestion, which relay that the cough became  productive with greenish sputum.  He then developed shortness of breath  and chest tightness over the last 3 days prior to admission.  He relates  that the cough was getting worse.  He tried over-the-counter medication  without any relief for cough and shortness of breath.  He relayed a  temperature of 100.2 at home, which resolved with Tylenol.   PHYSICAL EXAMINATION:  VITAL SIGNS:  Temperature 99.2, blood pressure  142/86, pulse 103, respiration 24, sats 78 on room air.  GENERAL:  The patient is alert and oriented in no acute distress.  EYES:  Pupils equal and reactive to light.  Extraocular muscle intact.  ENT:  Moist mucous membranes.  Tonsils are not well visualized.  NECK:  Obese, mobile.  RESPIRATION:  Occasional rhonchi on the left.  Diffuse crackles over  right field.  CARDIOVASCULAR:  S1 and S2, tachycardic.  Regular rhythm and rate.  No  murmur.  GASTROINTESTINAL:  Soft, mild tender to palpation in the upper quadrant.  Bowel sounds positive.  EXTREMITIES:  No edema.  SKIN:  Moist.  No rashes.  NEUROLOGIC:  Nonfocal.  PSYCHIATRY:  Appropriate.   LABORATORY DATA:  Sodium 143, potassium 3.9, chloride 106, bicarb 27,  BUN 15, creatinine 1.4, and glucose 116.  White blood cells 14.9,  hemoglobin 11.2, platelets 255, hematocrit 34.9, ANC 86%, and MCV 74.  D-  dimer 0.29.  Chest x-ray, right more than left airspace disease  compatible with pneumonia, right pleural effusion.  PROBLEMS:  1. Community-acquired pneumonia.  The patient was admitted to the step-      down unit.  He was started for treatment of community-acquired      pneumonia, ceftriaxone and azithromycin.  Within 24 hours, the      patient became more hypoxic.  He was transferred to the ICU unit.      ABG showed hypoxemia with a pH at 7.39, pCO2 39, pO2 75, bicarb 24.      He was started on BiPAP for a period of 4 hours, but then he became      more short of breath, using accessory muscle and he was getting      tired. He was intubated.  Chest x-ray at that moment showed      worsening airspace disease on the right side.  Antibiotics was      changed.  He was started on vancomycin, ceftriaxone dose was      increased and he was also started on Tamiflu.  H1N1 test was      negative.  Legionella antigen and strep pneumoniae was negative.      The patient  remained intubated for 4 days.  After extubation, his      respiratory status improved.  He was sating on 94% on 3 L.  He      completed an 8-day course of antibiotics.  Sputum culture showed no      growth.  Blood cultures negative.  We did not have any cultured      organism available.  The patient repeated chest x-ray showed      improvement of the airspace lung disease and on the day of      discharge, he was sating 95% on room air.  2. Acute respiratory failure secondary to pneumonia.  Please refer to      problem #1.  3. Acute renal failure likely secondary to ATN, secondary to episode      of hypotension.  His creatinine improved during this      hospitalization for the first week.  His creatinine on admission      was 1.22 then increased to 1.76 and the maximum was 2.0, then      creatinine was trending down nicely to 1.4 and 1.1.  Day prior to      discharge, creatinine increased slightly to 1.5.  He will have a      repeat BMET the day after discharge to monitor the creatinine.  Of      note, Hydrochlorothiazide was started 24 hours before the increase      in creatinine. For this reason HCTZ was discontinued at discharge.      This will be reassessed by Dr Sherryll Burger when patient is seen in the      clinic.  4. Hypertension.  The patient was started on metoprolol 50 twice a      day.  He was then started on hydrochlorothiazide, but this      medication was stopped due to mild increase in creatinine level.      We will need to monitor his blood pressure as an outpatient and      start a second medication as needed.   On the day of discharge, the patient was in good condition.  /VITAL SIGNS/>  Blood pressure 150/91, respirations 18, pulse 83, temperature 98.3, and  sats 95% on room air.   LABORATORY DATA:  Sodium 139, potassium 3.6, chloride 104, bicarb 28,  glucose 91, BUN 21, and creatinine 1.5.  White blood cells 14.8,  hemoglobin 10.5, hematocrit 33.0, and platelets  361.      Hartley Barefoot, MD  Electronically Signed      Alvester Morin, M.D.  Electronically Signed    BR/MEDQ  D:  01/12/2009  T:  01/13/2009  Job:  [id number]

## 2012-12-04 ENCOUNTER — Telehealth: Payer: Self-pay | Admitting: Cardiology

## 2012-12-04 NOTE — Telephone Encounter (Signed)
Pt never Picked up Attending Physicians Statement, Mailing to Pt Home Address  12/04/12/KM

## 2016-06-03 NOTE — Progress Notes (Signed)
Created by [employer] for [employer] in error.
